# Patient Record
Sex: Male | Born: 1980 | Hispanic: Yes | Marital: Married | Smoking: Never smoker
Health system: Southern US, Community
[De-identification: ages and names within clinical notes are randomized; demographics above are authoritative.]

## PROBLEM LIST (undated history)

## (undated) DIAGNOSIS — I10 Essential (primary) hypertension: Secondary | ICD-10-CM

## (undated) DIAGNOSIS — J329 Chronic sinusitis, unspecified: Secondary | ICD-10-CM

## (undated) HISTORY — DX: Essential (primary) hypertension: I10

## (undated) HISTORY — DX: Chronic sinusitis, unspecified: J32.9

## (undated) HISTORY — PX: NO PAST SURGERIES: SHX2092

---

## 2020-12-29 ENCOUNTER — Other Ambulatory Visit (HOSPITAL_COMMUNITY): Payer: Self-pay | Admitting: Family Medicine

## 2020-12-29 ENCOUNTER — Other Ambulatory Visit: Payer: Self-pay | Admitting: Family Medicine

## 2020-12-29 DIAGNOSIS — N281 Cyst of kidney, acquired: Secondary | ICD-10-CM

## 2021-01-06 ENCOUNTER — Other Ambulatory Visit: Payer: Self-pay

## 2021-01-06 ENCOUNTER — Ambulatory Visit (HOSPITAL_COMMUNITY)
Admission: RE | Admit: 2021-01-06 | Discharge: 2021-01-06 | Disposition: A | Discharge status: Home / Self Care | Payer: Self-pay | Source: Ambulatory Visit | Attending: Family Medicine | Admitting: Family Medicine

## 2021-01-06 DIAGNOSIS — N281 Cyst of kidney, acquired: Secondary | ICD-10-CM | POA: Insufficient documentation

## 2021-02-02 ENCOUNTER — Encounter (HOSPITAL_COMMUNITY): Payer: Self-pay | Admitting: Radiology

## 2021-05-30 ENCOUNTER — Encounter: Payer: Self-pay | Admitting: Cardiology

## 2021-05-30 DIAGNOSIS — I10 Essential (primary) hypertension: Secondary | ICD-10-CM | POA: Insufficient documentation

## 2021-05-30 DIAGNOSIS — J329 Chronic sinusitis, unspecified: Secondary | ICD-10-CM | POA: Insufficient documentation

## 2021-05-30 DIAGNOSIS — Z6830 Body mass index (BMI) 30.0-30.9, adult: Secondary | ICD-10-CM | POA: Insufficient documentation

## 2021-05-31 ENCOUNTER — Encounter: Payer: Self-pay | Admitting: Cardiology

## 2021-06-01 ENCOUNTER — Ambulatory Visit (INDEPENDENT_AMBULATORY_CARE_PROVIDER_SITE_OTHER): Payer: Self-pay | Admitting: Cardiology

## 2021-06-01 ENCOUNTER — Encounter: Payer: Self-pay | Admitting: Cardiology

## 2021-06-01 VITALS — BP 158/118 | HR 75 | Ht 70.0 in | Wt 219.6 lb

## 2021-06-01 DIAGNOSIS — I10 Essential (primary) hypertension: Secondary | ICD-10-CM

## 2021-06-01 MED ORDER — SPIRONOLACTONE 25 MG PO TABS: 25.0000 mg | ORAL_TABLET | Freq: Every day | ORAL | 1 refills | Status: DC

## 2021-06-01 NOTE — Progress Notes (Signed)
Cardiology Office Note  Date: 06/01/2021   ID: Brent Estrada, DOB [birth year], MRN [id number]  PCP:  Wilmon Pali, FNP  Cardiologist:  Nona Dell, MD Electrophysiologist:  None   Chief Complaint  Patient presents with   Hypertension    History of Present Illness: Brent Estrada is a 41 y.o. male referred for cardiology consultation by Ms. Collins NP for assistance with management of hypertension.  I reviewed the available records.  He was just recently diagnosed with high blood pressure a few months ago and started on lisinopril which has been uptitrated to 20 mg daily.  He states that his mother and some of his cousins have high blood pressure.  He has been relatively healthy otherwise, no major hospitalizations or surgeries, no type 2 diabetes mellitus.  I am not certain about his lipid status.  He does not report any exertional chest pain or breathlessness, no palpitations or syncope.  I personally reviewed his ECG today which shows normal sinus rhythm.  I also reviewed his lab work obtained by PCP in April.  He has a home blood pressure cuff.  Past Medical History:  Diagnosis Date   Essential hypertension    Sinusitis     Past Surgical History:  Procedure Laterality Date   NO PAST SURGERIES      Current Outpatient Medications  Medication Sig Dispense Refill   lisinopril (ZESTRIL) 10 MG tablet Take 1 tablet by mouth in the morning and at bedtime.     loratadine (CLARITIN) 10 MG tablet Take 1 tablet by mouth as needed.     meclizine (ANTIVERT) 25 MG tablet Take 1 tablet by mouth 3 (three) times daily as needed.     spironolactone (ALDACTONE) 25 MG tablet Take 1 tablet (25 mg total) by mouth daily. 90 tablet 1   No current facility-administered medications for this visit.   Allergies:  Ibuprofen and Naproxen   Social History: The patient  reports that he has never smoked. He has never been exposed to tobacco smoke. He has never  used smokeless tobacco.   Family History: The patient's family history includes Diabetes in his paternal grandfather; Hypertension in his mother.   ROS: No orthopnea or PND.  No leg swelling.  Physical Exam: VS:  BP (!) 158/118   Pulse 75   Ht 5\' 10"  (1.778 m)   Wt 219 lb 9.6 oz (99.6 kg)   SpO2 98%   BMI 31.51 kg/m , BMI Body mass index is 31.51 kg/m.  Wt Readings from Last 3 Encounters:  06/01/21 219 lb 9.6 oz (99.6 kg)    General: Patient appears comfortable at rest. HEENT: Conjunctiva and lids normal, oropharynx clear. Neck: Supple, no elevated JVP or carotid bruits, no thyromegaly. Lungs: Clear to auscultation, nonlabored breathing at rest. Cardiac: Regular rate and rhythm, no S3 or significant systolic murmur, no pericardial rub. Abdomen: Soft, nontender, bowel sounds present. Extremities: No pitting edema, distal pulses 2+. Skin: Warm and dry. Musculoskeletal: No kyphosis. Neuropsychiatric: Alert and oriented x3, affect grossly appropriate.  ECG:   No prior tracings for review today.  Recent Labwork:  April 2023: BUN 15, creatinine 1.11, potassium 3.1, AST 18, ALT 32, hemoglobin 15.8, platelets 239  Other Studies Reviewed Today:  No prior cardiac testing for review today.  Assessment and Plan:  High blood pressure, suspect essential hypertension although his baseline potassium was mildly low with normal renal function as of lab work in April.  May need to consider  further work-up for primary aldosteronism if blood pressure is not controlled with further medication adjustments, dietary measures with sodium restriction, and weight loss.  Continue lisinopril 10 mg twice daily, starting Aldactone 25 mg daily.  Check BMET in 2 weeks.  Arrange nurse visit with blood pressure check thereafter, he will bring in his home cuff to make sure that it correlates.  Medication Adjustments/Labs and Tests Ordered: Current medicines are reviewed at length with the patient today.   Concerns regarding medicines are outlined above.   Tests Ordered: Orders Placed This Encounter  Procedures   Basic metabolic panel   EKG 12-Lead    Medication Changes: Meds ordered this encounter  Medications   spironolactone (ALDACTONE) 25 MG tablet    Sig: Take 1 tablet (25 mg total) by mouth daily.    Dispense:  90 tablet    Refill:  1    06/01/21 New Start    Disposition:  Follow up  2 months.  Signed, Jonelle Sidle, MD, [facility] 06/01/2021 9:19 AM    [facility] at [facility] [address] [place], [place], [place] [address] Phone: [phone]; Fax: [phone]

## 2021-06-01 NOTE — Patient Instructions (Addendum)
Medication Instructions:  Your physician has recommended you make the following change in your medication:  Start aldactone 25 mg once a day Continue all other medications as directed  Labwork: BMET in 2 weeks ([facility])  Testing/Procedures: none  Follow-Up: Your physician recommends that you schedule a follow-up appointment in: 2 month follow up Nurse Visit in 3 weeks for blood pressure monitoring. Please bring your home blood pressure machine to this visit.  Any Other Special Instructions Will Be Listed Below (If Applicable).  If you need a refill on your cardiac medications before your next appointment, please call your pharmacy.

## 2021-06-22 ENCOUNTER — Ambulatory Visit (INDEPENDENT_AMBULATORY_CARE_PROVIDER_SITE_OTHER): Payer: Self-pay | Admitting: *Deleted

## 2021-06-22 ENCOUNTER — Encounter: Payer: Self-pay | Admitting: *Deleted

## 2021-06-22 VITALS — BP 151/108 | HR 79 | Ht 70.0 in | Wt 215.6 lb

## 2021-06-22 DIAGNOSIS — I10 Essential (primary) hypertension: Secondary | ICD-10-CM

## 2021-06-22 MED ORDER — AMLODIPINE BESYLATE 5 MG PO TABS: 5.0000 mg | ORAL_TABLET | Freq: Every day | ORAL | 1 refills | Status: DC

## 2021-06-22 NOTE — Patient Instructions (Signed)
We will call you with any changes your provider suggest after he has reviewed your visit from today. Continue same medications and follow up for now

## 2021-06-22 NOTE — Progress Notes (Signed)
Presents to office for nurse visit to have BP checked after starting spironolactone 25 mg daily. Reports he has not been checking his home blood pressures and did not remember to bring home BP monitor to visit today. Denies dizziness, chest pain or sob. Reports taking all medications as prescribed without missing dose or side effects. Medications reviewed. Vitals taken and routed to MD for review. Recent lab work done at [facility] requested.

## 2021-06-22 NOTE — Progress Notes (Signed)
Brent Sark, MD  Merlene Laughter, RN Reviewed nursing visit.  Also went over recent lab work, potassium 3.4 and creatinine 1.29.  Continue lisinopril and Aldactone.  Would add Norvasc 5 mg daily next.     Patient informed and verbalized understanding of plan.

## 2021-07-31 NOTE — Progress Notes (Unsigned)
Cardiology Office Note  Date: 08/01/2021   ID: Brent Estrada, DOB 01-16-1980, MRN [id number]  PCP:  Wilmon Pali, FNP  Cardiologist:  Nona Dell, MD Electrophysiologist:  None   Chief Complaint  Patient presents with   Cardiac follow-up    History of Present Illness: Brent Estrada is a 41 y.o. male last seen in May.  He is here for a follow-up visit.  Blood pressure trend has improved significantly following medication adjustments.  He is on Aldactone and more recently Norvasc in addition to initial dose of lisinopril.  Follow-up lab work is reviewed below.  He reports no functional limitations, no exertional chest pain or shortness of breath.  Per chart review I do see that he had an abdominal ultrasound back in December 2022.  He was found to have simple appearing cysts in the kidneys but also a 12 mm echogenic area in the upper pole of the left kidney of undetermined significance.  CT imaging was recommended for correlation.  I discussed this with him today.  Past Medical History:  Diagnosis Date   Essential hypertension    Sinusitis     Past Surgical History:  Procedure Laterality Date   NO PAST SURGERIES      Current Outpatient Medications  Medication Sig Dispense Refill   amLODipine (NORVASC) 5 MG tablet Take 1 tablet (5 mg total) by mouth daily. 90 tablet 1   lisinopril (ZESTRIL) 10 MG tablet Take 1 tablet by mouth in the morning and at bedtime.     spironolactone (ALDACTONE) 25 MG tablet Take 1 tablet (25 mg total) by mouth daily. 90 tablet 1   No current facility-administered medications for this visit.   Allergies:  Ibuprofen and Naproxen   ROS: No palpitations or syncope.  Physical Exam: VS:  BP 132/82 (BP Location: Left Arm, Patient Position: Sitting, Cuff Size: Large)   Pulse 72   Ht 5\' 10"  (1.778 m)   Wt 217 lb (98.4 kg)   SpO2 99%   BMI 31.14 kg/m , BMI Body mass index is 31.14 kg/m.  Wt Readings from Last 3  Encounters:  08/01/21 217 lb (98.4 kg)  06/22/21 215 lb 9.6 oz (97.8 kg)  06/01/21 219 lb 9.6 oz (99.6 kg)    General: Patient appears comfortable at rest. HEENT: Conjunctiva and lids normal, oropharynx clear. Neck: Supple, no elevated JVP or carotid bruits, no thyromegaly. Lungs: Clear to auscultation, nonlabored breathing at rest. Cardiac: Regular rate and rhythm, no S3 or significant systolic murmur. Extremities: No pitting edema.  ECG:  An ECG dated 06/01/2021 was personally reviewed today and demonstrated:  Sinus rhythm.  Recent Labwork:  June 2023: Potassium 3.4, BUN 13, creatinine 1.29  Other Studies Reviewed Today:  Renal July 2023 01/06/2021: IMPRESSION: 1. Simple appearing cysts within the bilateral kidneys. 2. 12 mm echogenic area upper pole left kidney, indeterminate for echogenic lesion such as angiomyolipoma or potential nonshadowing cortical calcification. Correlation with CT if not already performed could be considered.  Assessment and Plan:  1.  Uncontrolled hypertension, trend improving significantly however with medication adjustments.  He is currently on lisinopril, Aldactone, and Norvasc.  Recent lab work reviewed above.  Continuing to work on diet and sodium restriction.  Also has home blood pressure cuff now and checking measurements on his own.  Today's blood pressure was down to 132/82.  2.  Simple renal cysts, also 12 mm echogenic area in the upper pole of the left kidney of uncertain significance.  This is based on ultrasound imaging from December 2022.  We will schedule a noncontrasted CT of the abdomen and pelvis for further investigation.  Medication Adjustments/Labs and Tests Ordered: Current medicines are reviewed at length with the patient today.  Concerns regarding medicines are outlined above.   Tests Ordered: No orders of the defined types were placed in this encounter.   Medication Changes: No orders of the defined types were placed in this  encounter.   Disposition:  Follow up  6 months.  Signed, Jonelle Sidle, MD, [facility] 08/01/2021 9:18 AM    [facility] at [facility] [address] [place], [place], [place] [address] Phone: [phone]; Fax: [phone]

## 2021-08-01 ENCOUNTER — Ambulatory Visit (INDEPENDENT_AMBULATORY_CARE_PROVIDER_SITE_OTHER): Payer: Self-pay | Admitting: Cardiology

## 2021-08-01 ENCOUNTER — Encounter: Payer: Self-pay | Admitting: Cardiology

## 2021-08-01 VITALS — BP 132/82 | HR 72 | Ht 70.0 in | Wt 217.0 lb

## 2021-08-01 DIAGNOSIS — N281 Cyst of kidney, acquired: Secondary | ICD-10-CM

## 2021-08-01 DIAGNOSIS — I10 Essential (primary) hypertension: Secondary | ICD-10-CM

## 2021-08-01 NOTE — Patient Instructions (Signed)
Medication Instructions:  Your physician recommends that you continue on your current medications as directed. Please refer to the Current Medication list given to you today.   Labwork: none  Testing/Procedures: Non-Cardiac CT scanning, (CAT scanning), is a noninvasive, special x-ray that produces cross-sectional images of the body using x-rays and a computer. CT scans help physicians diagnose and treat medical conditions. For some CT exams, a contrast material is used to enhance visibility in the area of the body being studied. CT scans provide greater clarity and reveal more details than regular x-ray exams.   Follow-Up:  Your physician recommends that you schedule a follow-up appointment in: 6 months  Any Other Special Instructions Will Be Listed Below (If Applicable).  If you need a refill on your cardiac medications before your next appointment, please call your pharmacy.

## 2021-08-04 ENCOUNTER — Ambulatory Visit (HOSPITAL_COMMUNITY)
Admission: RE | Admit: 2021-08-04 | Discharge: 2021-08-04 | Disposition: A | Discharge status: Home / Self Care | Payer: Self-pay | Source: Ambulatory Visit | Attending: Cardiology | Admitting: Cardiology

## 2021-08-04 DIAGNOSIS — N281 Cyst of kidney, acquired: Secondary | ICD-10-CM | POA: Insufficient documentation

## 2021-11-22 ENCOUNTER — Other Ambulatory Visit: Payer: Self-pay | Admitting: *Deleted

## 2021-11-22 MED ORDER — SPIRONOLACTONE 25 MG PO TABS: 25.0000 mg | ORAL_TABLET | Freq: Every day | ORAL | 1 refills | Status: DC

## 2021-12-13 ENCOUNTER — Other Ambulatory Visit: Payer: Self-pay | Admitting: *Deleted

## 2021-12-13 MED ORDER — AMLODIPINE BESYLATE 5 MG PO TABS: 5.0000 mg | ORAL_TABLET | Freq: Every day | ORAL | 1 refills | Status: DC

## 2022-01-31 NOTE — Progress Notes (Signed)
Cardiology Office Note  Date: 02/01/2022   ID: Brent Estrada, DOB 09-16-1980, MRN [id number]  PCP:  Coolidge Breeze, FNP  Cardiologist:  Rozann Lesches, MD Electrophysiologist:  None   Chief Complaint  Patient presents with   Cardiac follow-up    History of Present Illness: Brent Estrada is a 42 y.o. male last seen in July 2023.  He is here for a follow-up visit.  States that he feels well, no headaches, no exertional chest pain, stable NYHA class I dyspnea.  No palpitations or syncope.  I did review his recent lab work per PCP, he was able to access it through EMR on his phone.  Creatinine normal, BUN mildly increased.  He has not had any obvious bleeding problems.  Did discuss adequate fluid intake.  We went over his medications, he reports compliance with therapy.  States that systolics are usually in the 140s on average.  Trend still much better than in the past.  He is more focused on his diet and exercise, would like to lose some weight.  Past Medical History:  Diagnosis Date   Essential hypertension    Sinusitis    Current Outpatient Medications  Medication Sig Dispense Refill   amLODipine (NORVASC) 5 MG tablet Take 1 tablet (5 mg total) by mouth daily. 90 tablet 1   lisinopril (ZESTRIL) 10 MG tablet Take 1 tablet by mouth in the morning and at bedtime.     spironolactone (ALDACTONE) 25 MG tablet Take 1 tablet (25 mg total) by mouth daily. 90 tablet 1   No current facility-administered medications for this visit.   Allergies:  Ibuprofen and Naproxen   ROS: No orthopnea or PND.  Physical Exam: VS:  BP (!) 142/90   Pulse 71   Ht 5\' 10"  (1.778 m)   Wt 218 lb 6.4 oz (99.1 kg)   SpO2 98%   BMI 31.34 kg/m , BMI Body mass index is 31.34 kg/m.  Wt Readings from Last 3 Encounters:  02/01/22 218 lb 6.4 oz (99.1 kg)  08/01/21 217 lb (98.4 kg)  06/22/21 215 lb 9.6 oz (97.8 kg)    General: Patient appears comfortable at rest. HEENT:  Conjunctiva and lids normal. Neck: Supple, no elevated JVP or carotid bruits. Lungs: Clear to auscultation, nonlabored breathing at rest. Cardiac: Regular rate and rhythm, no S3 or significant systolic murmur. Extremities: No pitting edema.  ECG:  An ECG dated 06/01/2021 was personally reviewed today and demonstrated:  Sinus rhythm.  Recent Labwork:  June 2023: Potassium 3.4, BUN 13, creatinine 1.29  Other Studies Reviewed Today:  CT abdomen and pelvis 08/04/2021: IMPRESSION: 1. Two hypodense lesions in the upper poles of the kidneys which are not well evaluated on this study, however appear to be simple cysts on previous ultrasound. 2. Focal cortical calcification in the upper left kidney consistent with the hyperechoic focus on ultrasound. 3. Prostatomegaly.  Assessment and Plan:  Essential hypertension, blood pressure trend better but still not optimal, we did discuss diet and exercise today.  He reports compliance with his medical regimen currently including Norvasc, lisinopril, and Aldactone.  He will keep follow-up with PCP.  If additional blood pressure control ultimately necessary despite lifestyle modification, would suggest increasing Norvasc further next.  Medication Adjustments/Labs and Tests Ordered: Current medicines are reviewed at length with the patient today.  Concerns regarding medicines are outlined above.   Tests Ordered: No orders of the defined types were placed in this encounter.  Medication Changes: No orders of the defined types were placed in this encounter.   Disposition:  Follow up  1 year.  Signed, Satira Sark, MD, [facility] 02/01/2022 9:58 AM    [facility] [place], [place], [place] [address] Phone: [phone]; Fax: [phone]

## 2022-02-01 ENCOUNTER — Ambulatory Visit: Payer: Self-pay | Attending: Cardiology | Admitting: Cardiology

## 2022-02-01 ENCOUNTER — Encounter: Payer: Self-pay | Admitting: Cardiology

## 2022-02-01 VITALS — BP 142/90 | HR 71 | Ht 70.0 in | Wt 218.4 lb

## 2022-02-01 DIAGNOSIS — I10 Essential (primary) hypertension: Secondary | ICD-10-CM

## 2022-02-01 NOTE — Patient Instructions (Addendum)

## 2022-07-30 ENCOUNTER — Encounter: Payer: Self-pay | Admitting: Nurse Practitioner

## 2022-07-30 ENCOUNTER — Ambulatory Visit: Payer: Self-pay | Attending: Nurse Practitioner | Admitting: Nurse Practitioner

## 2022-07-30 VITALS — BP 128/81 | HR 67 | Wt 199.8 lb

## 2022-07-30 DIAGNOSIS — E785 Hyperlipidemia, unspecified: Secondary | ICD-10-CM

## 2022-07-30 DIAGNOSIS — I10 Essential (primary) hypertension: Secondary | ICD-10-CM

## 2022-07-30 MED ORDER — LISINOPRIL 10 MG PO TABS: ORAL_TABLET | ORAL | 3 refills | Status: DC

## 2022-07-30 NOTE — Patient Instructions (Addendum)
Medication Instructions:  Your physician has recommended you make the following change in your medication:  Reduce Lisinopril to (1) 10 Mg tablet daily in the morning.  Continue all other medications as prescribed.   Labwork: none  Testing/Procedures: none  Follow-Up: Your physician recommends that you schedule a follow-up appointment in: 1 Year with Dr.McDowell   Any Other Special Instructions Will Be Listed Below (If Applicable).  Your ideal top number of you Blood Pressure should be Less than 130, If within 2-3 weeks your blood pressure rises please contact our office and let us know!   If you need a refill on your cardiac medications before your next appointment, please call your pharmacy.

## 2022-07-30 NOTE — Progress Notes (Signed)
  Cardiology Office Note:  .   Date:  07/30/2022  ID:  Brent Estrada, DOB [birth year], MRN [id number] PCP: Wilmon Pali, FNP  [facility] Providers Cardiologist:  Nona Dell, MD    History of Present Illness: .   Brent Estrada is a 42 y.o. male with a PMH of HTN and HLD who presents today for follow-up. Last seen by Dr. Diona Browner on February 01, 2022. Was doing well at the time. BP better, but not optimal. Discussed lifestyle interventions.   Today he presents for follow-up. Doing well. He states he was recently seen by PCP. Shows me screenshots on his phone. All of his labs are WNL, except for LDL at 118 and states PCP is having him focus on lifestyle modifications prior to medication management. Has focused on diet and exercised and has lost 25 lbs. Denies any chest pain, shortness of breath, palpitations, syncope, presyncope, dizziness, orthopnea, PND, swelling or significant weight changes, acute bleeding, or claudication. BP overall well controlled at home. Would like to reduce his pill burden.   Studies Reviewed: Marland Kitchen    EKG:  EKG Interpretation Date/Time:  Monday July 30 2022 10:58:50 EDT Ventricular Rate:  68 PR Interval:  164 QRS Duration:  74 QT Interval:  374 QTC Calculation: 397 R Axis:   29  Text Interpretation: Normal sinus rhythm Normal ECG No previous ECGs available Confirmed by Sharlene Dory [id number]) on 07/30/2022 11:13:33 AM   Physical Exam:   VS:  BP 128/81   Pulse 67   Wt 199 lb 12.8 oz (90.6 kg)   SpO2 98%   BMI 28.67 kg/m    Wt Readings from Last 3 Encounters:  07/30/22 199 lb 12.8 oz (90.6 kg)  02/01/22 218 lb 6.4 oz (99.1 kg)  08/01/21 217 lb (98.4 kg)    GEN: Well nourished, well developed in no acute distress NECK: No JVD; No carotid bruits CARDIAC: S1/S2, RRR, no murmurs, rubs, gallops RESPIRATORY:  Clear to auscultation without rales, wheezing or rhonchi  ABDOMEN: Soft, non-tender, non-distended EXTREMITIES:  No  edema; No deformity   ASSESSMENT AND PLAN: .    HTN BP well controlled. Requesting that he would like to reduce his pill burden. We will stop his evening dose of Lisinopril, therefore he will take Lisinopril 10 mg daily. Continue amlodipine 5 mg daily and Aldactone 25 mg daily. Discussed to monitor BP at home at least 2 hours after medications and sitting for 5-10 minutes. Given BP log. Discussed SBP goal < 130. Heart healthy diet and regular cardiovascular exercise encouraged. Will refill lisinopril per his request.   2. HLD LDL with PCP was 118. Heart healthy diet and regular cardiovascular exercise encouraged. Discussed lifestyle modifications. Being managed by PCP. He verbalized understanding.    Dispo: Follow-up with Dr. Diona Browner or APP in 1 year or sooner if anything changes.   Signed, Sharlene Dory, NP

## 2023-05-18 IMAGING — US US RENAL
1 series · 14 of 25 positions shown · non-contrast
Comparison: None.

CLINICAL DATA: Cyst

EXAM:
RENAL / URINARY TRACT ULTRASOUND COMPLETE

[Series 1: us renal · 14 of 52 slices shown]
[im 1/52]
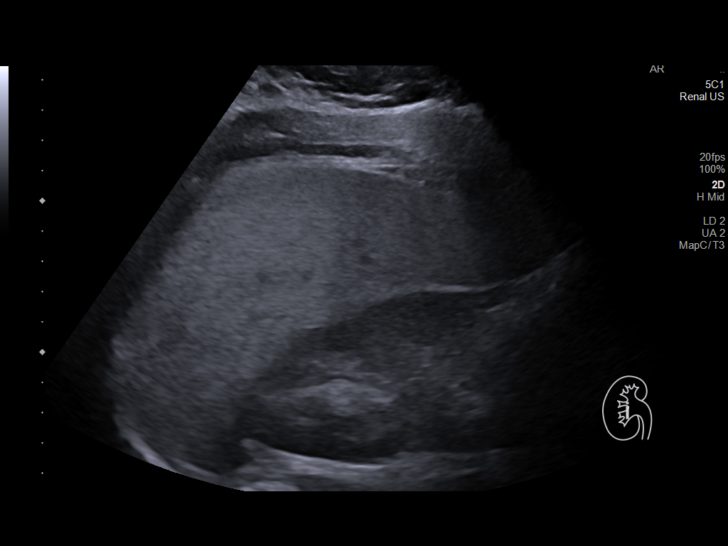
[im 5/52]
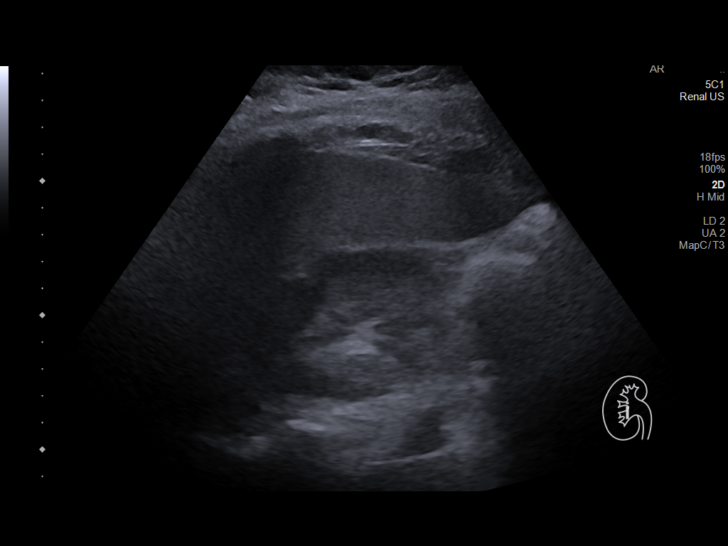
[im 9/52]
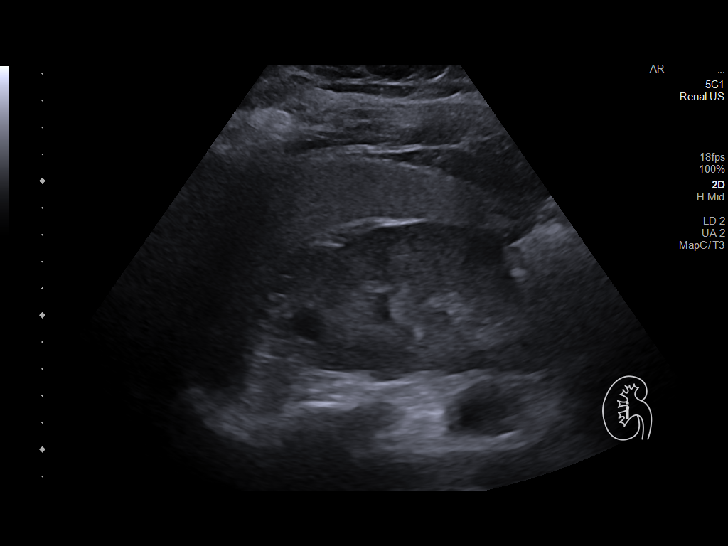
[im 13/52]
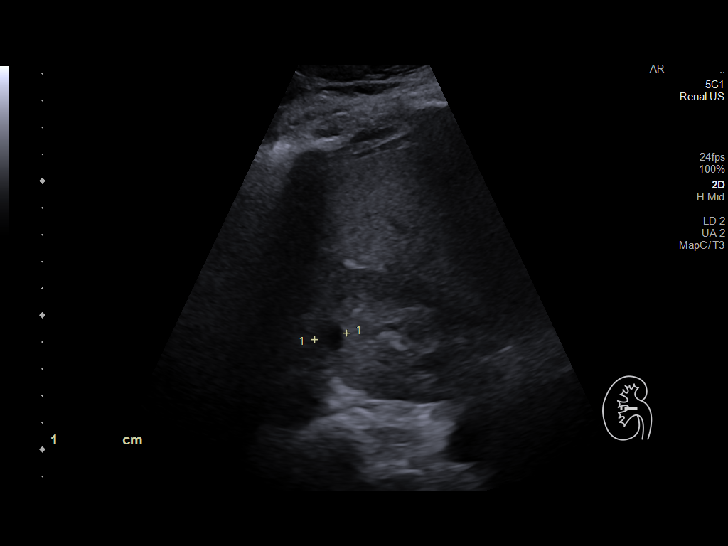
[im 18/52]
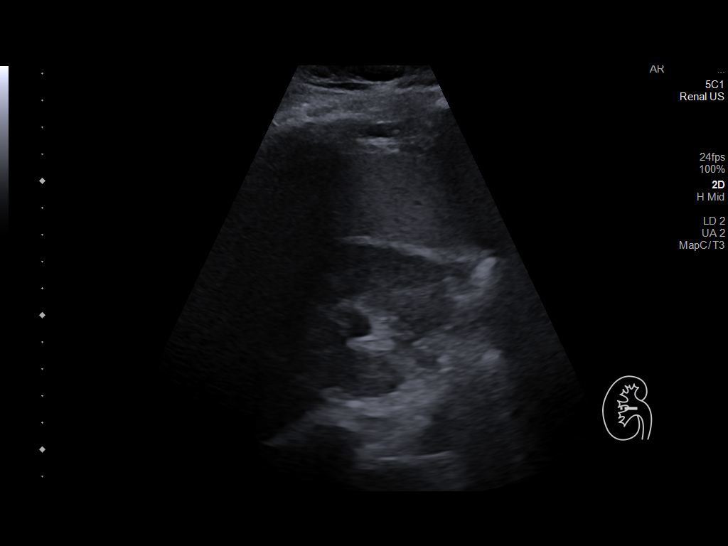
[im 20/52]
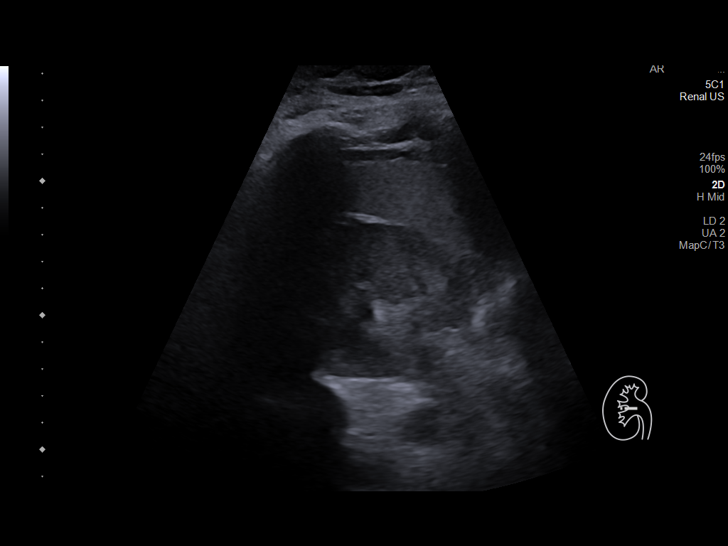
[im 24/52]
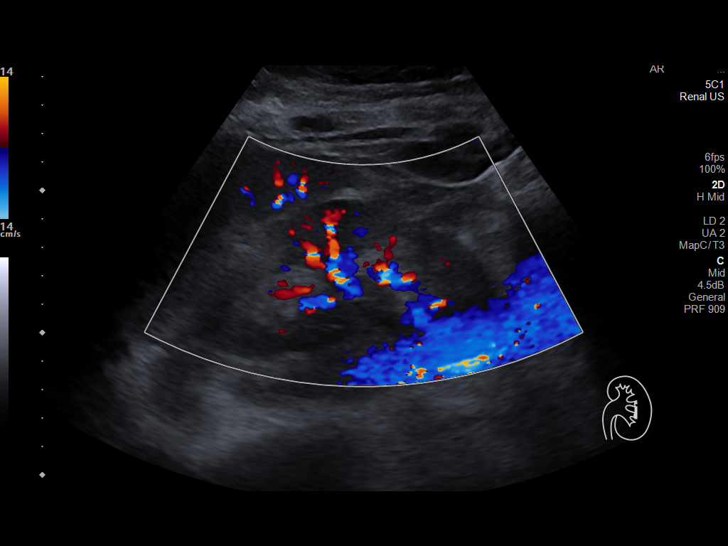
[im 28/52]
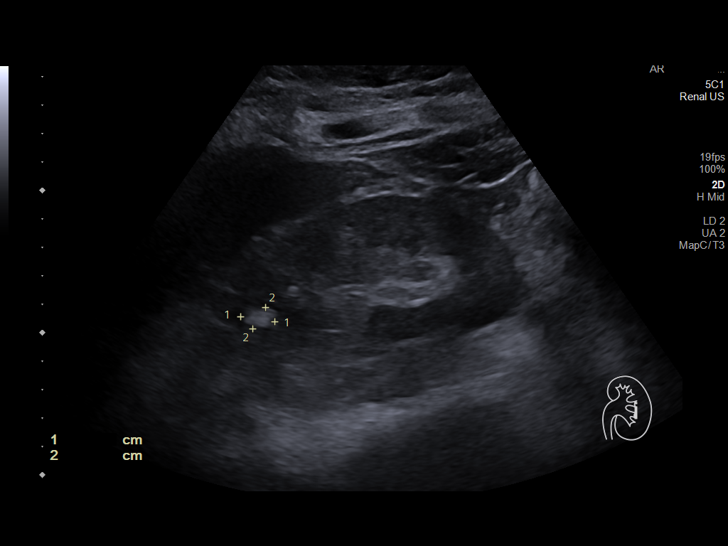
[im 32/52]
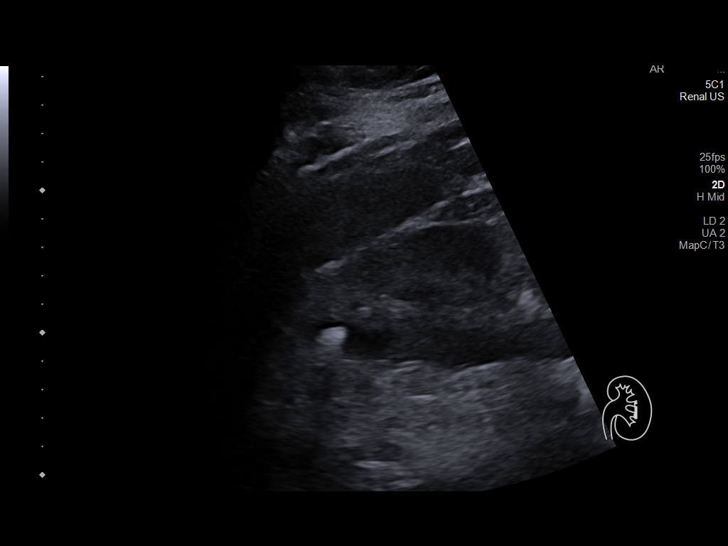
[im 35/52]
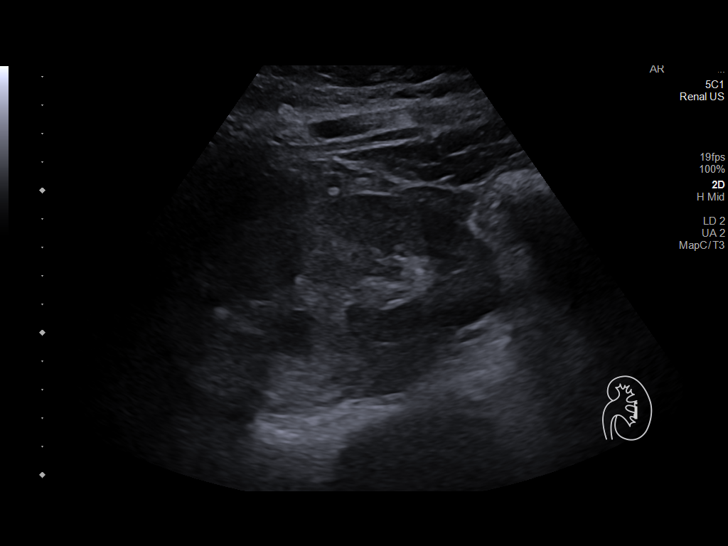
[im 39/52]
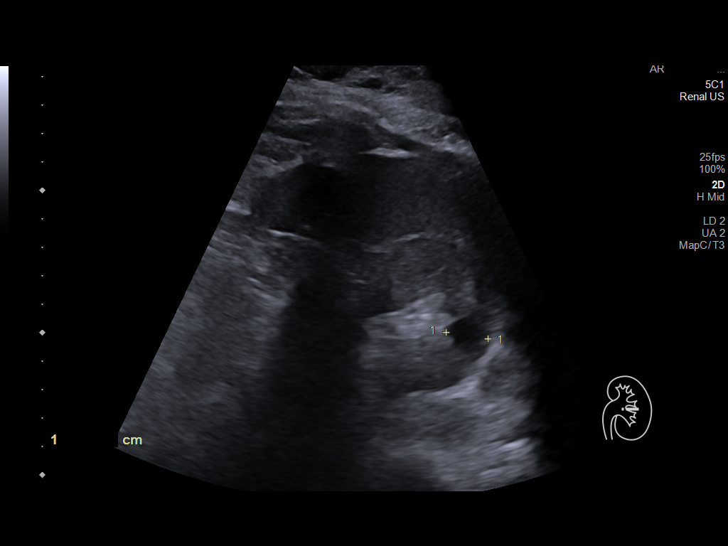
[im 43/52]
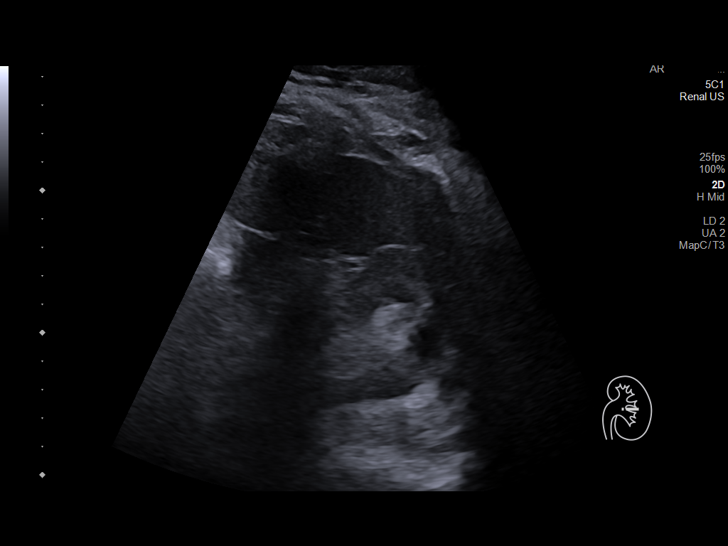
[im 47/52]
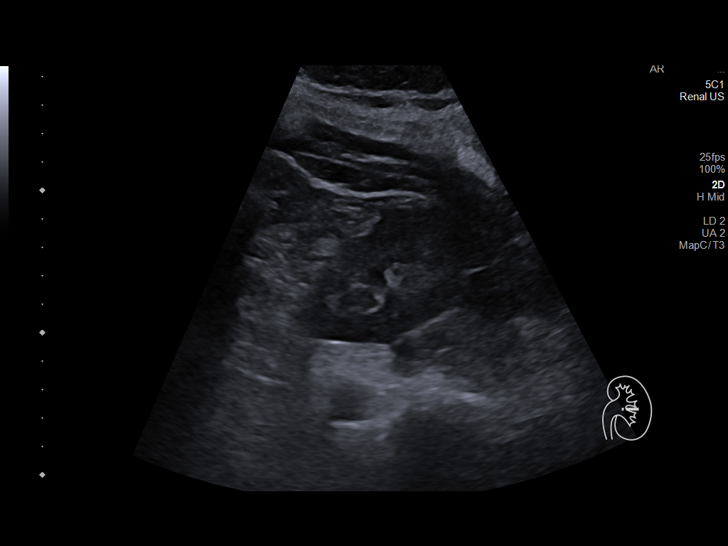
[im 52/52]
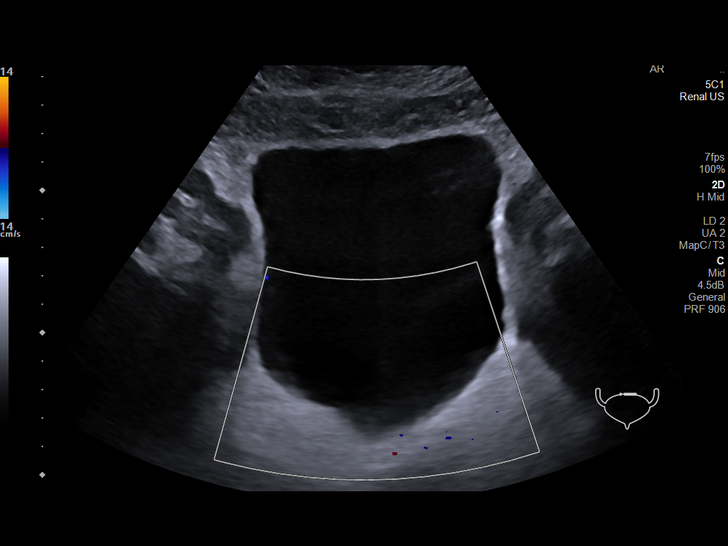

[14 of 25 positions shown; findings below may reference images not displayed]

FINDINGS: Right Kidney:

Renal measurements: 12.2 x 5.9 x 6.4 cm = volume: 240.5 mL.
Echogenicity within normal limits. No hydronephrosis. Cyst at the
upper pole measuring 13 mm.

Left Kidney:

Renal measurements: 11.4 x 5.8 x 6.4 cm = volume: 219 mL.
Echogenicity within normal limits. No hydronephrosis. Cyst at the
upper pole measuring 19 mm. Adjacent to this is an echogenic area
measuring 12 mm

Bladder:

Appears normal for degree of bladder distention.

Other:

None.
IMPRESSION: 1. Simple appearing cysts within the bilateral kidneys.
2. 12 mm echogenic area upper pole left kidney, indeterminate for
echogenic lesion such as angiomyolipoma or potential nonshadowing
cortical calcification. Correlation with CT if not already performed
could be considered.

## 2023-10-21 ENCOUNTER — Encounter: Payer: Self-pay | Admitting: Cardiology

## 2023-10-21 ENCOUNTER — Ambulatory Visit: Payer: Self-pay | Attending: Cardiology | Admitting: Cardiology

## 2023-10-21 VITALS — BP 148/98 | HR 63 | Ht 70.0 in | Wt 202.0 lb

## 2023-10-21 DIAGNOSIS — I10 Essential (primary) hypertension: Secondary | ICD-10-CM

## 2023-10-21 NOTE — Patient Instructions (Addendum)
 Medication Instructions:  Your physician recommends that you continue on your current medications as directed. Please refer to the Current Medication list given to you today.  Labwork: none  Testing/Procedures: none  Follow-Up: Your physician recommends that you schedule a follow-up appointment in: 1 year. You will receive a reminder call in about 8-10 months reminding you to schedule your appointment. If you don't receive this call, please contact our office.  Any Other Special Instructions Will Be Listed Below (If Applicable). You have been referred to PharmD for a consultation in 2 weeks Your physician has requested that you regularly monitor and record your blood pressure readings at home. Please use the same machine at the same time of day to check your readings and record them to bring to your follow-up visit.  If you need a refill on your cardiac medications before your next appointment, please call your pharmacy.

## 2023-10-21 NOTE — Progress Notes (Signed)
    Cardiology Office Note  Date: 10/21/2023   ID: Brent Estrada, DOB 1980-12-21, MRN [id number]  History of Present Illness: Brent Estrada is a 43 y.o. male last seen in July 2024 by Ms. Miriam NP, I reviewed her note.  He is here for a routine visit.  States that he has been doing well, he has lost weight through diet, reports no exertional limitations.  He checks blood pressure every other day at home and generally reports systolics in the 120s.  Blood pressure up today, repeated by me as well.  He is currently on a course of steroids which may be a contributor.  He reports compliance with his medications otherwise, antihypertensive regimen includes lisinopril  20 mg daily at this point.  In the past he had also been on Norvasc  and Aldactone .  I reviewed his ECG today which shows normal sinus rhythm.  Physical Exam: VS:  BP (!) 152/102 (BP Location: Left Arm)   Pulse 63   Ht 5' 10 (1.778 m)   Wt 202 lb (91.6 kg)   SpO2 98%   BMI 28.98 kg/m , BMI Body mass index is 28.98 kg/m.  Wt Readings from Last 3 Encounters:  10/21/23 202 lb (91.6 kg)  07/30/22 199 lb 12.8 oz (90.6 kg)  02/01/22 218 lb 6.4 oz (99.1 kg)    General: Patient appears comfortable at rest. HEENT: Conjunctiva and lids normal. Neck: Supple, no elevated JVP or carotid bruits. Lungs: Clear to auscultation, nonlabored breathing at rest. Cardiac: Regular rate and rhythm, no S3 or significant systolic murmur. Extremities: No pitting edema.  ECG:  An ECG dated 07/30/2022 was personally reviewed today and demonstrated:  Sinus rhythm.  Labwork:  June 2024: Cholesterol 185, triglycerides 123, HDL 44, LDL 118, BUN 28, creatinine 1.26, potassium 4.1, AST 13, ALT 14, GFR 74, hemoglobin 14.8, platelets 212  Other Studies Reviewed Today:  No interval cardiac testing for review today.  Assessment and Plan:  Primary hypertension.  Reports systolic in the 120s per home checks on lisinopril  20 mg  daily until recently.  Blood pressure elevated today in the setting of completing a steroid taper which is likely contributing.  Plan to schedule a Pharm.D. follow-up visit in the next 2 weeks for blood pressure recheck and potential medication titration if necessary.  He will bring in daily blood pressure measurements for the week prior to that visit.  Disposition:  Follow up 1 year.  Signed, Jayson JUDITHANN Sierras, M.D., F.A.C.C. [facility]

## 2023-11-13 NOTE — Progress Notes (Unsigned)
 Patient ID: Brent Estrada                 DOB: Aug 07, 1980                      MRN: [id number]      HPI: Brent Estrada is a 43 y.o. male referred by Dr. Dr.McDowell to HTN clinic. PMH is significant for HTN and HLD   Pt saw Dr. Debera on 10/06 his BP was elevated. Reported home BP averages at goal <130/80 compliant with Lisinopril  20 mg daily. In the past he had also been on Norvasc  and Aldactone . Pt lost some weight through diet. Reported he was on short course of oral steroid which can be contributory factor for elevated BP  Pt was referred to PharmD for hypertension management   Patient presented today for hypertension clinic. Patient brought in few home BP readings. Some were at goal but most were above the goal. Review correct steps for home BP measurement. Pt  follows correct steps encourage to bring home machine for validation at the next visit. Patient lost lot of weight from healthy lifestyle intervention and regular exercise however pt admits he does not watch salt intake. He gets headaches when BP is high.   Current HTN meds: Lisinopril  20 mg daily  Previously tried: none - spironolactone  and Norvasc  was stopped as his BP was improved with weight loss  BP goal: <120/80  Family History:  Relation Problem Comments  Mother Hypertension     Father   Sister   Sister   Sister   Paternal Grandfather Diabetes     Daughter   Daughter   Son      Social History:  Smoke: none  Alcohol: 1 -2 beer once a month  Diet: healthy home cooked meals but dose not watch salt intake   Exercise: walk 3 miles -40 min and pushups every day    Home BP readings: mostly above the goa 134-150 /88-99   Wt Readings from Last 3 Encounters:  10/21/23 202 lb (91.6 kg)  07/30/22 199 lb 12.8 oz (90.6 kg)  02/01/22 218 lb 6.4 oz (99.1 kg)   BP Readings from Last 3 Encounters:  11/14/23 (!) 155/95  10/21/23 (!) 148/98  07/30/22 128/81   Pulse Readings from Last 3  Encounters:  10/21/23 63  07/30/22 67  02/01/22 71    Renal function: CrCl cannot be calculated (No successful lab value found.).  Past Medical History:  Diagnosis Date   Essential hypertension    Sinusitis     Current Outpatient Medications on File Prior to Visit  Medication Sig Dispense Refill   amLODipine  (NORVASC ) 5 MG tablet Take 1 tablet (5 mg total) by mouth daily. (Patient not taking: Reported on 10/21/2023) 90 tablet 1   methylPREDNISolone (MEDROL DOSEPAK) 4 MG TBPK tablet Take 4 mg by mouth as directed.     No current facility-administered medications on file prior to visit.    Allergies  Allergen Reactions   Ibuprofen Hives   Naproxen Hives    Blood pressure (!) 155/95.   Assessment/Plan:  1. Hypertension -  Problem  Hypertension   Current HTN meds: Lisinopril  20 mg daily  Previously tried: none - spironolactone  and Norvasc  was stopped as his BP was improved with weight loss  BP goal: <120/80    Hypertension Assessment: BP is uncontrolled in office BP 146/84  mmHg 1st reading 155/95 mmHg 2nd reading (goal <130/80) Tolerates lisinopril   20 mg daily without side effects   Denies SOB, palpitation, chest pain, headaches,or swelling Reiterated the importance of  low salt diet   Plan:  Increase lisinopril  from 20 mg to 40 mg daily last BMP was from May 2025 result reviewed K was WNL and and renal function was stable   Pt will be sing PCP on Nov 14 will get f/u BMP at that visit and will share with us   Will call patient in Nov to schedule apt in Dec    Thank you  Robbi Blanch, [place].D [facility] [facility]sp[facility] BIRCH. [facility] [address], [place], [place] [address] Phone: [phone]; Fax: [phone]

## 2023-11-14 ENCOUNTER — Encounter: Payer: Self-pay | Admitting: Pharmacist

## 2023-11-14 ENCOUNTER — Ambulatory Visit: Payer: Self-pay | Attending: Internal Medicine | Admitting: Pharmacist

## 2023-11-14 VITALS — BP 155/95

## 2023-11-14 DIAGNOSIS — I10 Essential (primary) hypertension: Secondary | ICD-10-CM

## 2023-11-14 MED ORDER — LISINOPRIL 40 MG PO TABS: 40.0000 mg | ORAL_TABLET | Freq: Every day | ORAL | 3 refills | Status: DC

## 2023-11-14 NOTE — Patient Instructions (Signed)
 Changes made by your pharmacist Robbi Blanch, PharmD at today's visit:    Instructions/Changes  (what do you need to do) Your Notes  (what you did and when you did it)  Increase lisinopril  to 40 mg daily   Lower salt intake    Continue doing exercise regularly     Bring all of your meds, your BP cuff and your record of home blood pressures to your next appointment.    HOW TO TAKE YOUR BLOOD PRESSURE AT HOME  Rest 5 minutes before taking your blood pressure.  Don't smoke or drink caffeinated beverages for at least 30 minutes before. Take your blood pressure before (not after) you eat. Sit comfortably with your back supported and both feet on the floor (don't cross your legs). Elevate your arm to heart level on a table or a desk. Use the proper sized cuff. It should fit smoothly and snugly around your bare upper arm. There should be enough room to slip a fingertip under the cuff. The bottom edge of the cuff should be 1 inch above the crease of the elbow. Ideally, take 3 measurements at one sitting and record the average.  Important lifestyle changes to control high blood pressure  Intervention  Effect on the BP  Lose extra pounds and watch your waistline Weight loss is one of the most effective lifestyle changes for controlling blood pressure. If you're overweight or obese, losing even a small amount of weight can help reduce blood pressure. Blood pressure might go down by about 1 millimeter of mercury (mm Hg) with each kilogram (about 2.2 pounds) of weight lost.  Exercise regularly As a general goal, aim for at least 30 minutes of moderate physical activity every day. Regular physical activity can lower high blood pressure by about 5 to 8 mm Hg.  Eat a healthy diet Eating a diet rich in whole grains, fruits, vegetables, and low-fat dairy products and low in saturated fat and cholesterol. A healthy diet can lower high blood pressure by up to 11 mm Hg.  Reduce salt (sodium) in your  diet Even a small reduction of sodium in the diet can improve heart health and reduce high blood pressure by about 5 to 6 mm Hg.  Limit alcohol One drink equals 12 ounces of beer, 5 ounces of wine, or 1.5 ounces of 80-proof liquor.  Limiting alcohol to less than one drink a day for women or two drinks a day for men can help lower blood pressure by about 4 mm Hg.   If you have any questions or concerns please use My Chart to send questions or call the office at [phone]

## 2023-11-14 NOTE — Assessment & Plan Note (Signed)
 Assessment: BP is uncontrolled in office BP 146/84  mmHg 1st reading 155/95 mmHg 2nd reading (goal <130/80) Tolerates lisinopril  20 mg daily without side effects   Denies SOB, palpitation, chest pain, headaches,or swelling Reiterated the importance of  low salt diet   Plan:  Increase lisinopril  from 20 mg to 40 mg daily last BMP was from May 2025 result reviewed K was WNL and and renal function was stable   Pt will be sing PCP on Nov 14 will get f/u BMP at that visit and will share with us   Will call patient in Nov to schedule apt in Dec

## 2023-11-22 NOTE — Progress Notes (Deleted)
  Patient ID: Brent Estrada                 DOB: 1980/11/11                      MRN: [id number]      HPI: Brent Estrada is a 43 y.o. male referred by Dr. Dr.McDowell to HTN clinic. PMH is significant for HTN and HLD   Pt saw Dr. Debera on 10/06 his BP was elevated. Reported home BP averages at goal <130/80 compliant with Lisinopril  20 mg daily. In the past he had also been on Norvasc  and Aldactone . Pt lost some weight through diet. Reported he was on short course of oral steroid which can be contributory factor for elevated BP  Pt was referred to PharmD for hypertension management   Patient presented today for hypertension clinic. Patient brought in few home BP readings. Some were at goal but most were above the goal. Review correct steps for home BP measurement. Pt  follows correct steps encourage to bring home machine for validation at the next visit. Patient lost lot of weight from healthy lifestyle intervention and regular exercise however pt admits he does not watch salt intake. He gets headaches when BP is high.   Current HTN meds: Lisinopril  20 mg daily  Previously tried: none - spironolactone  and Norvasc  was stopped as his BP was improved with weight loss  BP goal: <120/80  Family History:  Relation Problem Comments  Mother Hypertension     Father   Sister   Sister   Sister   Paternal Grandfather Diabetes     Daughter   Daughter   Son      Social History:  Smoke: none  Alcohol: 1 -2 beer once a month  Diet: healthy home cooked meals but dose not watch salt intake   Exercise: walk 3 miles -40 min and pushups every day    Home BP readings: mostly above the goa 134-150 /88-99   Wt Readings from Last 3 Encounters:  10/21/23 202 lb (91.6 kg)  07/30/22 199 lb 12.8 oz (90.6 kg)  02/01/22 218 lb 6.4 oz (99.1 kg)   BP Readings from Last 3 Encounters:  11/14/23 (!) 155/95  10/21/23 (!) 148/98  07/30/22 128/81   Pulse Readings from Last 3  Encounters:  10/21/23 63  07/30/22 67  02/01/22 71    Renal function: CrCl cannot be calculated (No successful lab value found.).  Past Medical History:  Diagnosis Date   Essential hypertension    Sinusitis     Current Outpatient Medications on File Prior to Visit  Medication Sig Dispense Refill   lisinopril  (ZESTRIL ) 40 MG tablet Take 1 tablet (40 mg total) by mouth daily. 90 tablet 3   methylPREDNISolone (MEDROL DOSEPAK) 4 MG TBPK tablet Take 4 mg by mouth as directed.     No current facility-administered medications on file prior to visit.    Allergies  Allergen Reactions   Ibuprofen Hives   Naproxen Hives    There were no vitals taken for this visit.   Assessment/Plan:  1. Hypertension -  No problems updated.  No problem-specific Assessment & Plan notes found for this encounter.   Thank you  Robbi Blanch, Pharm.D [facility] [facility] BIRCH. [facility] [address], [place], [place] [address] Phone: [phone]; Fax: [phone]

## 2023-11-25 ENCOUNTER — Telehealth: Payer: Self-pay | Admitting: Pharmacist

## 2023-11-25 NOTE — Telephone Encounter (Signed)
 Pt is requesting a callback from pharmacist regarding a later appt in December and why. Please advise

## 2023-11-28 ENCOUNTER — Ambulatory Visit: Payer: Self-pay | Admitting: Pharmacist

## 2023-12-09 NOTE — Telephone Encounter (Signed)
 Apt scheduled for Dec 18,2025 at 9:30 at [facility] location

## 2024-01-02 ENCOUNTER — Ambulatory Visit: Payer: Self-pay

## 2024-01-23 ENCOUNTER — Ambulatory Visit: Payer: Self-pay

## 2024-01-28 ENCOUNTER — Telehealth: Payer: Self-pay | Admitting: Cardiology

## 2024-01-28 ENCOUNTER — Encounter: Payer: Self-pay | Admitting: Nurse Practitioner

## 2024-01-28 ENCOUNTER — Ambulatory Visit: Payer: Self-pay | Attending: Nurse Practitioner | Admitting: Nurse Practitioner

## 2024-01-28 VITALS — BP 155/101 | HR 76 | Ht 70.0 in | Wt 196.6 lb

## 2024-01-28 DIAGNOSIS — E785 Hyperlipidemia, unspecified: Secondary | ICD-10-CM

## 2024-01-28 DIAGNOSIS — I10 Essential (primary) hypertension: Secondary | ICD-10-CM

## 2024-01-28 MED ORDER — LISINOPRIL-HYDROCHLOROTHIAZIDE 20-12.5 MG PO TABS: 1.0000 | ORAL_TABLET | Freq: Every day | ORAL | 5 refills | Status: AC

## 2024-01-28 NOTE — Telephone Encounter (Signed)
 Patient being seen today with Brent Estrada

## 2024-01-28 NOTE — Telephone Encounter (Addendum)
 Called to offer pt an appt with elizabeth today. He took the the appointment. Pt stated he is having trouble with his bp being elevated. He is taking his medication and recently a dr increased his dose. He still wanted me to send a message to triage incase he did not need to be seen today!

## 2024-01-28 NOTE — Progress Notes (Signed)
" °  Cardiology Office Note:  .   Date:  01/28/2024 ID:  Brent Estrada, DOB Sep 29, 1980, MRN [id number] PCP: Alston Silvio BROCKS, FNP  [facility] Providers Cardiologist:  Jayson Sierras, MD    History of Present Illness: .   Brent Estrada is a 44 y.o. male with a PMH of HTN and HLD who presents today for follow-up.   Last seen by Dr. Sierras on October 21, 2023.  Blood pressure was elevated in office as patient had completed a steroid taper, stated blood pressures at home are well-controlled.  Referral was placed to Pharm.D. for management and evaluation of his hypertension.  Today he is here for follow-up. Says his BP's have been elevated.  Says that when lisinopril  was previously increased to 40 mg daily, he developed a headache and was dizzy.  He is currently taking lisinopril  20 mg daily and has not noticed the symptoms. Denies any chest pain, shortness of breath, palpitations, syncope, presyncope, dizziness, orthopnea, PND, swelling or significant weight changes, acute bleeding, or claudication.  I have reviewed his labs from [facility] in [place] that were obtained in November 2025 that shows complete metabolic panel, vitamin D, and testosterone level all within normal limits.  His lipid panel showed total cholesterol 201, HDL 51, triglycerides 115, and LDL 127.  SH: He is very active and exercises 1 hour every day by either walking, jogging, or will do push-ups.  Family history: Family history is significant for high blood pressure and type 2 diabetes.  Studies Reviewed: SABRA    EKG: EKG is not ordered today.     Physical Exam:   VS:  BP (!) 155/101   Pulse 76   Ht 5' 10 (1.778 m)   Wt 196 lb 9.6 oz (89.2 kg)   SpO2 99%   BMI 28.21 kg/m    Wt Readings from Last 3 Encounters:  01/28/24 196 lb 9.6 oz (89.2 kg)  10/21/23 202 lb (91.6 kg)  07/30/22 199 lb 12.8 oz (90.6 kg)    GEN: Well nourished, well developed in no acute distress NECK:  No JVD; No carotid bruits CARDIAC: S1/S2, RRR, no murmurs, rubs, gallops RESPIRATORY:  Clear to auscultation without rales, wheezing or rhonchi  ABDOMEN: Soft, non-tender, non-distended EXTREMITIES:  No edema; No deformity   ASSESSMENT AND PLAN: .    HTN BP well elevated today.  He cannot tolerate higher dosage of lisinopril  and therefore will change his lisinopril  to lisinopril  with hydrochlorothiazide  20/12.5 mg daily.  He will obtain BMET in 2 weeks.  Discussed to monitor BP at home at least 2 hours after medications and sitting for 5-10 minutes. Given BP log. Discussed SBP goal < 130. Heart healthy diet and regular cardiovascular exercise encouraged.  2. HLD Last LDL with PCP from last November was not at goal.  Heart healthy diet and regular cardiovascular exercise encouraged. Discussed lifestyle modifications. Being managed by PCP. He verbalized understanding.  At next office visit, plan to discuss ASCVD risk score and consider screening for CAD.   I spent a total duration of 30 minutes reviewing prior notes, reviewing outside records including  labs, face-to-face counseling of medical condition, pathophysiology, evaluation, management, and documenting the findings in the note.   Dispo: Follow-up with Dr. Sierras or APP in 8 weeks or sooner if anything changes.   Signed, Almarie Crate, NP   "

## 2024-01-28 NOTE — Patient Instructions (Addendum)
 Medication Instructions:  Your physician has recommended you make the following change in your medication:  Stop Lisinopril  20 mg Start taking Lisinopril  20 mg-12.5 hydrochlorothiazide  once daily Continue taking all other medications as prescribed   Labwork: BMET in 2 weeks at [facility]  Testing/Procedures: None  Follow-Up: Your physician recommends that you schedule a follow-up appointment in: 8 weeks  Any Other Special Instructions Will Be Listed Below (If Applicable).   If you need a refill on your cardiac medications before your next appointment, please call your pharmacy.

## 2024-04-21 ENCOUNTER — Ambulatory Visit: Payer: Self-pay | Admitting: Nurse Practitioner
